# Patient Record
Sex: Male | Born: 1940 | Race: White | Hispanic: No | Marital: Married | Smoking: Former smoker
Health system: Southern US, Community
[De-identification: ages and names within clinical notes are randomized; demographics above are authoritative.]

## PROBLEM LIST (undated history)

## (undated) DIAGNOSIS — K219 Gastro-esophageal reflux disease without esophagitis: Secondary | ICD-10-CM

## (undated) HISTORY — PX: NEPHRECTOMY: SHX65

## (undated) HISTORY — PX: KNEE SURGERY: SHX244

## (undated) HISTORY — PX: EYE SURGERY: SHX253

---

## 2004-04-01 ENCOUNTER — Inpatient Hospital Stay (HOSPITAL_COMMUNITY): Admission: RE | Admit: 2004-04-01 | Discharge: 2004-04-04 | Payer: Self-pay | Admitting: Orthopaedic Surgery

## 2005-04-28 ENCOUNTER — Inpatient Hospital Stay (HOSPITAL_COMMUNITY): Admission: RE | Admit: 2005-04-28 | Discharge: 2005-04-30 | Payer: Self-pay | Admitting: Orthopaedic Surgery

## 2009-02-16 ENCOUNTER — Ambulatory Visit (HOSPITAL_COMMUNITY): Admission: RE | Admit: 2009-02-16 | Discharge: 2009-02-16 | Payer: Self-pay | Admitting: Ophthalmology

## 2010-03-24 LAB — CBC
HCT: 49.8 % (ref 39.0–52.0)
MCHC: 33.7 g/dL (ref 30.0–36.0)
RBC: 6.12 MIL/uL — ABNORMAL HIGH (ref 4.22–5.81)
RDW: 13.8 % (ref 11.5–15.5)

## 2010-03-24 LAB — BASIC METABOLIC PANEL
BUN: 14 mg/dL (ref 6–23)
CO2: 27 mEq/L (ref 19–32)
Creatinine, Ser: 1.1 mg/dL (ref 0.4–1.5)
GFR calc Af Amer: >60 mL/min (ref >60)
Glucose, Bld: 98 mg/dL (ref 70–99)
Potassium: 4.1 mEq/L (ref 3.5–5.1)

## 2010-05-21 NOTE — Discharge Summary (Signed)
Dean [name], [name]                  ACCOUNT NO.:  [id number]   MEDICAL RECORD NO.:  [id number]          PATIENT TYPE:  INP   LOCATION:  5033                         FACILITY:  [facility]   PHYSICIAN:  Claude Manges. Whitfield, M.D.DATE OF BIRTH:  September 01, 1940   DATE OF ADMISSION:  04/01/2004  DATE OF DISCHARGE:  04/04/2004                                 DISCHARGE SUMMARY   ADMISSION DIAGNOSES:  1.  End-stage osteoarthritis left knee.  2.  Tobacco abuse.  3.  Recent upper respiratory infection.   DISCHARGE DIAGNOSES:  1.  End-stage osteoarthritis left knee status post left total knee      arthroplasty.  2.  Acute blood loss anemia secondary to surgery.  3.  Tobacco abuse.  4.  Recent upper respiratory infection.   SURGICAL PROCEDURE:  On April 01, 2004, Dean [name] underwent a left total  knee arthroplasty by Dr. Claude Manges. Whitfield assisted by Richardean Canal,  P.A.C.  Dean [name] had an LCS complete metal back patella, cemented size large,  placed with a DePuy MBT keel tibial tray, cemented size 5.  An LCS complete  primary femoral component, cemented size large left, and an LCS complete RP  insert, size large, 12.5 mm thickness.   COMPLICATIONS:  None.   CONSULTS:  1.  Pharmacy consult for Coumadin therapy April 01, 2004.  2.  Physical therapy consult April 01, 2004.   HISTORY OF PRESENT ILLNESS:  This 70 year old white male patient presented  to Dr. Cleophas Dunker with a 6-7 year history of left knee pain.  At this point,  the pain is intermittent, sharp in sensation over the anterior aspect of the  joint without radiation.  It increases with walking and decreases with rest  and Tylenol or Aleve.  Dean [name] complains of some swelling of the knee.  Dean [name] has  failed conservative treatment and because of that Dean [name] is presenting for a  left knee replacement.   HOSPITAL COURSE:  Dean [name] tolerated surgical procedure well without  immediate postoperative complications.  Dean [name] was transferred to 5000.  Postop  day 1,  Tmax was 102, vitals were stable, left knee dressing was intact,  minimal drainage.  Hemoglobin 13.3, hematocrit 38.7.  Dean [name] was started on  therapy per protocol.   Postop day 2 Dean [name] was tolerating therapy well.  Dean [name] was able to be switched to  p.o. pain meds.  Tmax was 98.5, vitals were stable.   Dean [name] continued to do well over the next several days.  Pain became well  controlled.  April 04, 2004, Tmax had been 101.2, but repeat chest x-ray has  shown no active disease and his white count has decreased.  It was felt Dean [name]  was stable for discharge home and was discharged home later that day.   DISCHARGE INSTRUCTIONS:  Diet:  Dean [name] can resume his regular prehospitalization  diet.  Medications:  Dean [name] may resume his prehospitalization meds except no Relafen  while on Coumadin.  Home meds included only the Relafen 750 mg p.o. b.i.d.  Additional meds at this time include:  1.  Coumadin 5 mg one  tablet p.o. q.6 p.m. for 1 month, dose to be adjusted      by [facility].  2.  Percocet 5/325 mg 1-2 tablets p.o. q.4 hours p.r.n. for pain, 50 with no      refill.   ACTIVITY:  Dean [name] can be out of bed, partial weight bearing 50% or less in the  left leg with use of the walker.  Dean [name] is to have home CPM 0-100 degrees 6-8  hours a day and home health PT per [facility].  Please see the  blue Total Knee Discharge Sheet for further activity instructions.   WOUND CARE:  Dean [name] may shower after no drainage from the wound for 2 days.  Please see the blue Total Knee Discharge Sheet for further wound care  instructions.   FOLLOWUP:  Dean [name] needs to followup with Dr. Cleophas Dunker in our office in  approximately one week and needs to call [phone] for that appointment.   LABORATORY DATA:  Chest x-ray done on April 03, 2004, showed COPD changes  along with chronic interstitial scarring in the left lower lobe and lingular  region.  No acute overlying pulmonary process.   March 29, 2004, white count was 11.  It went to  a high of 14.6 on April 03, 2004, and down to 11.5 on April 04, 2004.  Hemoglobin and hematocrit remained  stable but dropped to a low of 12.5 and 37.2 on April 04, 2004.  Platelets  ranged from a high of 702 on March 29, 2004, to a low of 551 on April 03, 2004.   On April 04, 2004, PT was 20.3, INR 2.2.  On March 29, 2004, the potassium  was 5.6.  It was 4.2 on April 01, 2004 and April 03, 2004.  Glucose ranged  from 98 on March 29, 2004 and April 03, 2004, to a high of 103 on March 30-  31, 2006.  All other laboratory studies were within normal limits.       KED/MEDQ  D:  07/12/2004  T:  07/12/2004  Job:  [id number]

## 2010-05-21 NOTE — H&P (Signed)
Dean [name], Dean [name]                  ACCOUNT NO.:  [id number]   MEDICAL RECORD NO.:  [id number]          PATIENT TYPE:  INP   LOCATION:                               FACILITY:  [facility]   PHYSICIAN:  Claude Manges. Whitfield, M.D.DATE OF BIRTH:  August 26, 1940   DATE OF ADMISSION:  04/01/2004  DATE OF DISCHARGE:                                HISTORY & PHYSICAL   CHIEF COMPLAINT:  Left knee pain for the last six to seven years.   HISTORY OF PRESENT ILLNESS:  This 70 year old white male patient presented  to Dr. Cleophas Dunker with the six a seven-year history of left knee pain. He  said he has had problems in the past with his right knee for probably about  10 years and that he had a right knee arthroscopy by Dr. Cleophas Dunker in 1995.  The right knee is doing well now and the left knee is his more bothersome  knee. He has had no known injury or prior surgery to that left knee.   At this point the left knee pain is described as an intermittent sharp  sensation over the anterior aspect of the joint line without radiation. The  pain increases if he does any walking, if he had been the knee too much or  if he stands on it for any period of time. Pain decreases with rest and the  use of Tylenol or Aleve. He does complain of some swelling of the knee at  times but no popping, catching, grinding, locking or giving way. He has not  had any cortisone injections, viscosupplementation shots in the knee. He  does not ambulate with any assistive devices, but he does have difficulty  walking.   ALLERGIES:  PENICILLIN causes whelps.   CURRENT MEDICATIONS:  Relafen 750 mg one tablet p.o. b.i.d..   PAST MEDICAL HISTORY:  He denies any history of diabetes mellitus,  hypertension, thyroid disease, hiatal hernia, reflux, peptic ulcer disease,  heart disease, asthma or any other chronic medical condition other than  noted previously.   PAST SURGICAL HISTORY:  1.  Right knee arthroscopy by Dr. Claude Manges. Whitfield 1995.  2.  Skin graft to his left ring finger approximately 30 years ago.  3.  Removal left eye cataract.   He denies any complications from the above-mentioned procedures.   SOCIAL HISTORY:  He has a 30-pack-year history of cigarette smoking. He does  drink alcohol about twice a month. He does not use any drugs. He is married  and has two children. He lives with his wife in a one-story house with three  steps into the main entrance. 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 His medical doctor is Dr. Littie Deeds in [place], [place], and his phone number is [phone].   FAMILY HISTORY:  Mother died at age of 28 with hypertension. Father died at  age of 46 with a stroke. He has one living brother age 41 with coronary  artery disease and osteoarthritis. He has three living sisters age 64, 4  and 51 with one with  diabetes. He did have four brothers who have passed  away, one at age 61 with alcohol history. One age 48 due to a car accident,  one at age 59 with polio, one in his 66s with cancer. He also had two  sisters who have passed away, one in her 70s with emphysema one the other  one in her 87s with possible history of AIDS. The daughter is 58, son is 78  and they are both alive and well.   REVIEW OF SYSTEMS:  He has a partial plate denture on his upper jaw line and  full dentures on his lower jaw line. He has had a cold recently but it is  improving. 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 He does not have a living will nor a power of attorney. All other  systems were negative and noncontributory.   PHYSICAL EXAM:  Well-developed, well-nourished white male in no acute  distress. Talks easily with examiner. Walks with a significantly antalgic  gait and a limp. Mood and affect are  appropriate. Height 5 feet 9 inches way  to 170 pounds, BMI is 24.  VITAL SIGNS:  Temperature 97.6 degrees Fahrenheit, pulse 88, respirations 28  and BP 136/84.  HEENT: Normocephalic, atraumatic without frontal or maxillary sinus  tenderness to palpation. Conjunctiva pink. Sclerae and anicteric. PERLA.  EOMs intact. No visible external ear deformities. His hearing is a bit  decreased just generally. Tympanic membranes are pearly gray bilaterally  with good light reflex. Nose and nasal septum midline. Nasal mucosa pink and  moist without exudates or polyps noted. Buccal mucosa pink and moist.  Dentures in place. Pharynx without erythema or exudates. Tongue and uvula  midline. Tongue without fasciculations and uvula rises equal equally with  phonation.  NECK:  No visible masses or lesions noted. Trachea midline. No palpable  lymphadenopathy nor thyromegaly. Carotids +2 bilaterally without bruits.  Full range of motion and nontender to palpation along the cervical spine.  CARDIOVASCULAR:  Heart rate and rhythm regular. S1-S2 present without rubs,  clicks or murmurs noted.  RESPIRATORY:  Respirations even and unlabored. He does have scattered rales  or rhonchi in the left posterior fields. A little bit coarse breath sounds  on the right. These do clear a bit with coughing. He does have a  nonproductive cough at this time.  ABDOMEN:  __________ abdominal contour. Bowel sounds present x4 quadrants.  Soft, nontender to palpation without hepatosplenomegaly nor CVA tenderness.  Femoral pulses +1 bilaterally. Nontender to palpation along the vertebral  column.  BREASTS/GU/RECTAL:  These exams deferred at this time.  MUSCULOSKELETAL:  He is missing the tip of his left index finger from the  DIP joint distally but otherwise upper extremities has no visible  deformities and full range of motion of these extremities without pain. Radial pulses +2. He has full range of motion of his hips, ankles and  toes  bilaterally. DP and PT pulses are +1. No lower extremity edema, no calf pain  with palpation and negative Homans' sign bilaterally.   Right knee is lacking about 10 degrees of full extension.  He has flexion to  120 degrees with a minimal amount of crepitus. No effusion in the knee but  he is tender on both  the medial and lateral joint line, medial more so than  lateral. He is stable to varus and valgus stress. Negative anterior drawer.  Left knee skin is intact without erythema or ecchymosis. He is lacking 10  degrees of full extension, can flex that knee only to about 112 degrees with  a moderate amount of crepitus. He is tender to palpation over the medial  joint line. He is stable to varus and valgus stress. Negative anterior  drawer and a minimal +1 effusion.  NEUROLOGIC:  Alert and oriented x3. Cranial nerves II-XII are grossly  intact. Strength 5/5 bilateral upper and lower extremities. Rapid  alternating movements intact. Deep tendon reflexes 2+ bilateral upper and  lower extremities.   RADIOLOGIC FINDINGS:  X-rays taken of the left knee in February 2006 show a  5 degree varus deformity and complete absence of the medial joint line with  large osteophytes and subchondral sclerosis of both the femur and tibia.  There are degenerative changes noted of the patella and the lateral  compartment as well but not to the extent that there is medially.   IMPRESSION:  1.  End-stage osteoarthritis of the left knee.  2.  Tobacco abuse.  3.  Recent cold.   PLAN:  Mr. Szymczak will be admitted to [facility] on  April 01, 2004, where he will undergo a left total knee arthroplasty by Dr.  Claude Manges. Whitfield. He will undergo all the routine preoperative laboratory  tests and studies prior to this procedure. If he has any medical issues  while he is hospitalized,  we will consult one the Hospitalists.      KED/MEDQ  D:  03/24/2004  T:  03/24/2004  Job:  [id number]

## 2010-05-21 NOTE — Op Note (Signed)
NAMEDAK, SZUMSKI NO.:  [id number]   MEDICAL RECORD NO.:  [id number]          PATIENT TYPE:  INP   LOCATION:  2899                         FACILITY:  [facility]   PHYSICIAN:  Claude Manges. Whitfield, M.D.DATE OF BIRTH:  1940/12/12   DATE OF PROCEDURE:  04/28/2005  DATE OF DISCHARGE:                                 OPERATIVE REPORT   PREOPERATIVE DIAGNOSIS:  End-stage osteoarthritis right knee.   POSTOPERATIVE DIAGNOSIS:  End-stage osteoarthritis right knee.   PROCEDURE:  Right total knee replacement with computer navigation.   SURGEON:  Claude Manges. Cleophas Dunker, M.D.   ASSISTANT:  Richardean Canal, P.A.-C.   ANESTHESIA:  General orotracheal.   COMPLICATIONS:  There no intraoperative complications.   COMPONENTS:  DePuy LCS large femoral component.  A #5 rotating keeled tibial  tray with a 12 mm polyethylene bridging baring, a metal backed three pegged  large patella.  All components were secured polymethyl methacrylate.   PROCEDURE:  With the patient comfortable on the operating table and under  general anesthesia, the left lower extremity was placed a thigh tourniquet.  The leg was then prepped with Betadine scrub and DuraPrep from the  tourniquet to the midfoot.  Sterile draping was performed.   The extremity elevated was Esmarch exsanguinated with a proximal tourniquet  at 350 mmHg.   A midline longitudinal incision was made centered about the patella  extending from the superior pouch the tibial tubercle.  Via sharp  dissection, incision was carried down to subcutaneous tissue.  First layer  of capsule was incised in the midline. A medial parapatellar incision was  made in a straight longitudinal line with the Bovie.  The joint was entered  with minimal clear yellow joint effusion.   The patella was everted 180 degrees laterally.  The knee flexed to 90  degrees.  There was a fixed varus position.  There were  large osteophytes  along the medial and lateral  femoral condyle and complete absence of  articular cartilage in the medial compartment to a great extent lateral  compartment and patellofemoral joint.  A medial release was performed  because of the fixed varus position.  Osteophytes were removed and a  synovectomy performed.   Computer navigation was employed.  The Shantz pins were placed in the tibia  and two in the distal femur.  The computer arrays were then applied.  Points  were established using the pointer to establish the axis of rotation.  Further points were then made for the medial lateral malleolus and those  about the femur determine the appropriate size.  Flexion/extension gaps were  determined and we had to do a further medial release as we were still tight  and we were able to have symmetrical flexion extension gaps.   After computer navigation to establish the appropriate computer points, the  cutting jigs were applied and then fixed with small pins correspond to the  points that were made via the computer.  Cuts were then made on the proximal  tibia and then cuts were made on the femur, corresponding to the computer  navigation.  ACL and PCL were removed as were medial and lateral menisci,  osteophytes were removed from the medial and lateral femoral condyles  posteriorly and osteophytes were removed from the medial tibial plateau.  A  lamina spreader was inserted to remove remnants of ACL and PCL posteriorly  and the osteophytes.  We again checked all of our cuts with the computer and  were with excellent position.   The final cuts were then made on the femur.  Retractors were placed behind  the tibia and tibial cuts were made to correspond to a #5 rotating tibial  tray.  The center hole was made followed by the keeled cut.  The trial  tibial tray was inserted.  We determined that using the flexion/extension  gaps a 12.5 mm flexion and extension gaps were symmetrical.  The 12 mm  polyethylene component was then  inserted followed by the standard femur.  We  had full extension, no opening with varus or valgus stress and we had normal  alignment.   The patella was then prepared by removing 12 mm of bony thickness.  The  trial component was applied and again through a full range of motion there  was no subluxation, dislocation.   Trial components were removed.  Joint was then copiously irrigated with jet  saline.  With the knee flexed, the tibial tray was inserted with polymethyl  methacrylate.  Extraneous methacrylate was removed.  The polyethylene liner  applied followed by the cemented femur, extraneous methacrylate was removed.  The knee was placed in extension.  Patella applied with methacrylate and a  patellar clamp.   After complete maturation of methacrylate, the joint was inspected.  We had  excellent range of motion.  We had reestablished physiologic valgus and full  extension.  The patient had a fixed varus position preoperatively with about  15 degrees of flexion contracture.  Tourniquet was deflated.  Gross bleeders  were Bovie coagulated.  There was no further extraneous methacrylate.  Joint  was again copiously irrigated with saline solution.  Deep capsule closed  with interrupted #1 Ethibond, superficial capsule closed with a running 0  Vicryl, skin closed with skin clips.  Sterile bulky dressing was applied  followed by an Ace bandage.   Excellent capillary refill to the toe.   The patient tolerated the procedure without complications.      Claude Manges. Cleophas Dunker, M.D.  Electronically Signed     PWW/MEDQ  D:  04/28/2005  T:  04/29/2005  Job:  [id number]

## 2010-05-21 NOTE — Op Note (Signed)
Dean [name], Dean [name]                  ACCOUNT NO.:  [id number]   MEDICAL RECORD NO.:  [id number]          PATIENT TYPE:  INP   LOCATION:  2550                         FACILITY:  [facility]   PHYSICIAN:  Claude Manges. Whitfield, M.D.DATE OF BIRTH:  August 04, 1940   DATE OF PROCEDURE:  04/01/2004  DATE OF DISCHARGE:                                 OPERATIVE REPORT   PREOPERATIVE DIAGNOSIS:  End-stage osteoarthritis, left knee.   POSTOPERATIVE DIAGNOSIS:  End-stage osteoarthritis, left knee.   PROCEDURE:  Left total knee replacement (computer navigation).   SURGEON:  Claude Manges. Cleophas Dunker, M.D.   ASSISTANT:  Richardean Canal, P.A.   ANESTHESIA:  General orotracheal.   COMPLICATIONS:  None.   COMPONENTS:  Depuy LCS large femoral component with #5 rotating keeled  tibial tray with a 12.5-mm bridging bearing, a metal-backed 3-pegged  rotating patella; all were secured with polymethyl methacrylate.   PROCEDURE:  With the patient comfortable on the operating table and under  general orotracheal anesthesia, nursing staff inserted a Foley catheter.  The left lower extremity had been determined to be the operative extremity  preoperatively in the holding area.  It was placed in a thigh tourniquet and  then prepped with Betadine scrub and then Duraprep from the tourniquet to  the mid-foot.  Sterile draping was performed.  With the extremity still  elevated, it was Esmarch-exsanguinated with a proximal tourniquet at 350  mmHg.   A midline longitudinal incision was made centered around the patella,  extending from the superior parts of tibial tubercle.  Via sharp dissection,  the incision was carried down to the subcutaneous tissue.  The first layer  of capsule was incised in the midline.  There was a prepatellar bursa that  was excised.  A medial parapatellar incision was made in a straight  longitudinal line with the Bovie.  There was a small clear joint effusion.  The patella was then easily everted 180  degrees laterally and the knee  flexed to 90 degrees.  There was a fixed varus position and a medial release  was performed.  There were large osteophytes on the medial and lateral  femoral condyle and the medial tibial plateau.  There was complete absence  of articular cartilage in the medial femoral condyle __________  medial  tibial plateau.  There was considerable chondromalacia at the femoral  condyle laterally.  There was considerable chondromalacia of the patella.   Computer navigation was utilized.  Two Schanz pins were placed in the distal  femur and 2 in the mid-tibia and the arrays were then applied.  Using the  computer, we had excellent visualization of the arrays.  We then morphed the  femur and the tibia to obtain the appropriate landmarks to size the femoral  and tibial components.  We also used the tensioners to obtain the  appropriate flexion and extension; again, lateral release was necessary.  After establishing the landmarks, the computer indicated the size of the  prosthesis and the appropriate areas to place the cutting guides.  The  initial cut was made on the tibia transversely with  a 7-degree posterior  inclination.  We fine-tuned the cuts to correspond to the tibial  measurements; in a similar fashion, we made cuts on the femur, corresponding  with the femoral cuts.  Flexion and extension gap appeared to be symmetrical  with the 12.5-mm bridging bearing.  Retractors were placed about the tibia,  laminar spreaders were inserted, soft tissue was removed including the  medial and lateral menisci and any remnants of ACL and PCL.  Osteophytes  were removed from the posterior femoral condyle medially and laterally.  Tibial cuts were then made to accept a #5 tibial tray.  We trialed the femur  and the tibia with a 12.5-mm bridging bearing and had excellent position and  maybe 1-2 degrees of extension and no opening with the varus or valgus  stress.  The patella was then  prepared by removing 12 mm of bone, leaving  about 15 mm of patellar thickness.  The tri-peg guide was then applied and  the trial patella was then inserted.  We had an excellent range of motion  with no subluxation.   The trial components were then removed, the joint was copiously irrigated  with saline solution.  The #5 keeled tibial tray was then impacted with  polymethyl methacrylate.  A 12.5-mm polyethylene bearing was then applied,  followed by the femur with polymethyl methacrylate; extraneous methacrylate  was removed with Michaelle Copas.  The knee was placed in extension, the patella  applied with a bone clamp and methacrylate; extraneous methacrylate was  removed from about the patella.  After complete maturation of the  methacrylate, the joint was inspected and any extraneous hardened  methacrylate was removed with a small osteotome.  The wound was copiously  irrigated throughout the operative procedure with jet saline.  Tourniquet  was deflated and gross bleeders were Bovie-coagulated; we had a nice dry  field.  The deep capsule was then closed with interrupted #1 Ethibond,  superficial capsule closed with a running 0 Vicryl, subcu with 2-0 Vicryl,  skin closed with skin clips.  A sterile bulky dressing  was applied.   The patient tolerated the procedure without complications.      PWW/MEDQ  D:  04/01/2004  T:  04/01/2004  Job:  [id number]

## 2010-05-21 NOTE — H&P (Signed)
Dean [name], Dean [name]                  ACCOUNT NO.:  [id number]   MEDICAL RECORD NO.:  [id number]           PATIENT TYPE:   LOCATION:                                 FACILITY:   PHYSICIAN:  Legrand Pitts. Duffy, P.A.   DATE OF BIRTH:  25-Mar-1940   DATE OF ADMISSION:  04/28/2005  DATE OF DISCHARGE:                                HISTORY & PHYSICAL   CHIEF COMPLAINT:  Right knee pain for the last four to five years.   HISTORY OF PRESENT ILLNESS:  This 70 year old white male patient with a  history of a left knee replacement by Dr. Cleophas Dunker in March 2006 presents  with a four to five-year history of gradual onset but progressively  worsening right knee pain.  He does have a history of a right knee  arthroscopy in 2002 but no other injury to the knee.  The left knee  replacement is doing very well at this time.   At this point the right knee pain is described as constant with any  weightbearing and some aching sensation diffuse about the joint with  radiation into his ankle at times.  The pain increases with weightbearing  and decreases with Tylenol.  He denies any popping, catching, grinding,  locking, giving away or swelling of the knee.  He denies any night pain.  He  is not currently ambulating with any assistive devices and is currently  taking Tylenol Arthritis for the pain.   ALLERGIES:  PENICILLIN causes welts.   CURRENT MEDICATIONS:  Tylenol arthritis one tablet p.o. b.i.d. p.r.n.Marland Kitchen   PAST MEDICAL HISTORY:  He denies any history of diabetes mellitus,  hypertension, thyroid disease, hiatal hernia, reflux, peptic ulcer disease,  heart disease, asthma or any other chronic medical condition other than  noted previously.   PAST SURGICAL HISTORY:  1.  Right knee arthroscopy by Dr. Claude Manges.  [place], 1995.  2.  Skin graft to the left ring finger 30 years ago.  3.  Excision, left eye cataract, 2003.  4.  Excision, right eye cataract, 2006.  5.  Left total knee arthroplasty by Dr.  Claude Manges. [place] April 01, 2004.   He denies any complications from the above-mentioned procedures.   SOCIAL HISTORY:  He has a 35 pack-year history of cigarette smoking and  still currently smokes about a pack of cigarettes a day.  He drinks about  two to three beers a week.  He does not use any drugs.  He is married and  has two children.  He lives with his wife in a one-story house with three  steps into the main entrance.  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  His medical doctor is in a family practice in [place] at  [phone].   FAMILY HISTORY:  Mother died at age of 66 with hypertension.  Father died at  age of 19 with a stroke.  He has one living brother, age 7, with obesity  and coronary artery disease.  He has three living sisters aged 86, 24 and  57,  and one of those siblings have diabetes.  He had four brothers who  passed away, one at age 36 with alcohol, one at age 72 due to an MVA, and  one age 33 with polio, one in his 80s with cancer.  He also has had two  sisters who have passed away, one in her 75s with emphysema and the other in  her 43s with possible history of AIDS.  His daughter is 52 and his son is  22, and they are both alive and well.   REVIEW OF SYSTEMS:  He does have partial plate denture on the upper jaw line  and full dentures on the lower.  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  He has  had a cold for about a week with a nonproductive cough.  He does complain of  occasional shoulder pain.  He does not have a living will nor a power of  attorney.  All other systems are negative and noncontributory.   PHYSICAL EXAMINATION:  GENERAL:  Well-developed, well-nourished, thin white  male in no acute distress.  Walks with a slight limp.  Mood and affect are  appropriate.  Talks easily with examiner.  Height 5 feet 9 inches, weight  170 pounds, BMI is 24.  VITAL  SIGNS:  Temperature 98 degrees Fahrenheit, pulse 76, respirations 12  and BP 128/76.  HEENT:  Normocephalic, atraumatic, without frontal or maxillary sinus  tenderness to palpation.  Conjunctivae pink.  Sclerae anicteric. PERRLA.  EOMs intact.  No visible external ear deformities.  Hearing grossly intact.  Tympanic membranes pearly gray bilaterally with good light reflex.  Nose:  The nasal septum midline.  Nasal mucosa pink and moist without exudates or  polyps noted.  Buccal mucosa pink and moist.  Dentition has dentures in  place.  Pharynx without erythema or exudates.  Tongue and uvula midline.  Tongue without fasciculations and uvula rises equally with phonation.  NECK:  No visible masses or lesions noted.  Trachea midline.  No palpable  lymphadenopathy nor thyromegaly.  Carotids +2 bilaterally without bruits.  Full range of range of motion and nontender to palpation along the cervical  spine.  CARDIOVASCULAR:  Heart rate and rhythm regular.  S1 and S2 present without  rubs, clicks or murmurs noted.  RESPIRATORY:  Respirations even and unlabored.  Breath sounds clear to  auscultation bilaterally without rales or wheezes noted.  ABDOMEN:  Rounded abdominal contour.  Bowel sounds present x4 quadrants.  Soft, nontender to palpation without hepatosplenomegaly nor CVA tenderness.  Femoral pulses +2 bilaterally.  BACK:  Nontender to palpation along the vertebral column.  BREASTS, GENITOURINARY, RECTAL:  These exams deferred at this time.  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  He  has full range of motion of his upper extremities without pain.  Radial  pulses +2 bilaterally.  Full range of motion of his hips, ankles and toes  bilaterally.  DP and PT pulses are +2.  No calf pain with palpation. Negative Homans' sign and no lower extremity edema.   Left knee has a well-healed midline  incision.  Skin intact without erythema  or ecchymosis.  He has full extension and flexion to 110 degrees without  crepitus.  No pain with palpation along the joint line.  Stable to varus and  valgus stress.  Negative anterior drawer.  Right knee is lacking about 15  degrees to full extension, can flex to 110 degrees with mild crepitus.  He  is tender to palpation over the medial joint line.  No effusion.  Stable to  varus and valgus stress.  Negative anterior drawer.   NEUROLOGIC:  Alert and oriented x3.  Cranial nerves II-XII are grossly  intact.  Strength 5/5 bilateral upper and lower extremities.  Rapid  alternating movements intact.  Deep tendon reflexes 2+ bilateral upper and  lower extremities.  Sensation intact to light touch.   RADIOLOGIC FINDINGS:  X-rays taken of both knees in March 2007 show the left  knee prosthesis in good position and alignment with a good cement mantle.  The right knee appears to be in about 5 degrees of varus with bone-on-bone  contact in the medial compartment and significant degenerative changes in  all three compartments.   IMPRESSION:  1.  End-stage osteoarthritis, right knee, status post left knee replacement,      2006.  2.  History of cataracts.   PLAN:  Mr. Havard will be admitted to [facility] on April 28, 2005,  where he will undergo a right total knee arthroplasty by Dr. Claude Manges.  [place].  He will undergo all the routine preoperative laboratory tests  and studies prior to this procedure.  If he has any medical issues while he  is hospitalized, we will consult the hospitalists.      Legrand Pitts Duffy, P.A.     KED/MEDQ  D:  04/13/2005  T:  04/13/2005  Job:  [id number]

## 2016-07-19 ENCOUNTER — Emergency Department (HOSPITAL_COMMUNITY): Payer: Medicare Other

## 2016-07-19 ENCOUNTER — Emergency Department (HOSPITAL_COMMUNITY)
Admission: EM | Admit: 2016-07-19 | Discharge: 2016-07-19 | Disposition: A | Discharge status: Home / Self Care | Payer: Medicare Other | Attending: Emergency Medicine | Admitting: Emergency Medicine

## 2016-07-19 ENCOUNTER — Other Ambulatory Visit: Payer: Self-pay

## 2016-07-19 ENCOUNTER — Encounter (HOSPITAL_COMMUNITY): Payer: Self-pay

## 2016-07-19 DIAGNOSIS — Z87891 Personal history of nicotine dependence: Secondary | ICD-10-CM | POA: Insufficient documentation

## 2016-07-19 DIAGNOSIS — Z7982 Long term (current) use of aspirin: Secondary | ICD-10-CM | POA: Diagnosis not present

## 2016-07-19 DIAGNOSIS — H4901 Third [oculomotor] nerve palsy, right eye: Secondary | ICD-10-CM

## 2016-07-19 DIAGNOSIS — H532 Diplopia: Secondary | ICD-10-CM | POA: Insufficient documentation

## 2016-07-19 DIAGNOSIS — R51 Headache: Secondary | ICD-10-CM | POA: Diagnosis present

## 2016-07-19 DIAGNOSIS — Z862 Personal history of diseases of the blood and blood-forming organs and certain disorders involving the immune mechanism: Secondary | ICD-10-CM | POA: Diagnosis not present

## 2016-07-19 HISTORY — DX: Gastro-esophageal reflux disease without esophagitis: K21.9

## 2016-07-19 LAB — COMPREHENSIVE METABOLIC PANEL
ALK PHOS: 54 U/L (ref 38–126)
ALT: 11 U/L — AB (ref 17–63)
AST: 18 U/L (ref 15–41)
Albumin: 4 g/dL (ref 3.5–5.0)
Anion gap: 10 (ref 5–15)
BILIRUBIN TOTAL: 0.9 mg/dL (ref 0.3–1.2)
BUN: 21 mg/dL — ABNORMAL HIGH (ref 6–20)
CALCIUM: 9.7 mg/dL (ref 8.9–10.3)
CO2: 22 mmol/L (ref 22–32)
CREATININE: 1.4 mg/dL — AB (ref 0.61–1.24)
Chloride: 105 mmol/L (ref 101–111)
GFR calc non Af Amer: 47 mL/min — ABNORMAL LOW (ref >60)
GFR, EST AFRICAN AMERICAN: 55 mL/min — AB (ref >60)
Glucose, Bld: 99 mg/dL (ref 65–99)
Potassium: 4.7 mmol/L (ref 3.5–5.1)
SODIUM: 137 mmol/L (ref 135–145)
TOTAL PROTEIN: 7 g/dL (ref 6.5–8.1)

## 2016-07-19 LAB — I-STAT TROPONIN, ED: Troponin i, poc: 0 ng/mL (ref 0.00–0.08)

## 2016-07-19 LAB — DIFFERENTIAL
Basophils Absolute: 0.1 10*3/uL (ref 0.0–0.1)
Basophils Relative: 1 %
Eosinophils Absolute: 0.1 10*3/uL (ref 0.0–0.7)
Eosinophils Relative: 1 %
LYMPHS ABS: 1.5 10*3/uL (ref 0.7–4.0)
LYMPHS PCT: 23 %
MONO ABS: 0.7 10*3/uL (ref 0.1–1.0)
MONOS PCT: 11 %
NEUTROS ABS: 4.2 10*3/uL (ref 1.7–7.7)
Neutrophils Relative %: 64 %

## 2016-07-19 LAB — I-STAT CHEM 8, ED
BUN: 24 mg/dL — AB (ref 6–20)
CALCIUM ION: 1.19 mmol/L (ref 1.15–1.40)
CHLORIDE: 104 mmol/L (ref 101–111)
Creatinine, Ser: 1.7 mg/dL — ABNORMAL HIGH (ref 0.61–1.24)
GLUCOSE: 101 mg/dL — AB (ref 65–99)
HCT: 41 % (ref 39.0–52.0)
Hemoglobin: 13.9 g/dL (ref 13.0–17.0)
Potassium: 4.7 mmol/L (ref 3.5–5.1)
Sodium: 140 mmol/L (ref 135–145)
TCO2: 24 mmol/L (ref 0–100)

## 2016-07-19 LAB — CBC
HEMATOCRIT: 41.7 % (ref 39.0–52.0)
HEMOGLOBIN: 13.5 g/dL (ref 13.0–17.0)
MCH: 30.7 pg (ref 26.0–34.0)
MCHC: 32.4 g/dL (ref 30.0–36.0)
MCV: 94.8 fL (ref 78.0–100.0)
PLATELETS: 516 10*3/uL — AB (ref 150–400)
RBC: 4.4 MIL/uL (ref 4.22–5.81)
RDW: 14.5 % (ref 11.5–15.5)
WBC: 6.6 10*3/uL (ref 4.0–10.5)

## 2016-07-19 LAB — APTT: aPTT: 37 seconds — ABNORMAL HIGH (ref 24–36)

## 2016-07-19 LAB — SEDIMENTATION RATE: Sed Rate: 17 mm/hr — ABNORMAL HIGH (ref 0–16)

## 2016-07-19 LAB — PROTIME-INR
INR: 1.07
Prothrombin Time: 14 seconds (ref 11.4–15.2)

## 2016-07-19 MED ORDER — ASPIRIN EC 81 MG PO TBEC: 81.0000 mg | DELAYED_RELEASE_TABLET | Freq: Every day | ORAL | 0 refills | Status: AC

## 2016-07-19 MED ORDER — GADOBENATE DIMEGLUMINE 529 MG/ML IV SOLN
15.0000 mL | Freq: Once | INTRAVENOUS | Status: AC
  Administered 2016-07-19: 7 mL via INTRAVENOUS

## 2016-07-19 NOTE — Discharge Instructions (Signed)
Follow-up with your ophthalmologist for the results of the sedimentation rate.

## 2016-07-19 NOTE — ED Triage Notes (Addendum)
Pt sent here by eye dr. Pt endorses right eye pain, visual changes and double vision with headache x 2 weeks. Pt unable to detect movement in lower field peripheral field upon exam. VSS. Pt ambulatory and denies 1 sided weakness or any other neuro sx.

## 2016-07-19 NOTE — ED Provider Notes (Signed)
[facility]-EMERGENCY DEPT Provider Note   CSN: [id number] Arrival date & time: 07/19/16  1231     History   Chief Complaint Chief Complaint  Patient presents with  . Visual Field Change    HPI Dean [name] is a 76 y.o. male.  HPI Patient sent in from his ophthalmologist for stroke rule out. For the last 2 weeks approximately his had double vision. Also around 1 week ago started a headache behind his right eye. Has double vision. States he has double vision in his 1 eye but it goes away when he covers the other eye. No chest pain. No trouble breathing. No trauma. No fevers. States her headaches been there for around a week now. No difficulty walking. States we look straight ahead he will see to things. He had his eyes dilated at ophthalmology. Past Medical History:  Diagnosis Date  . GERD (gastroesophageal reflux disease)   Polycythemia  There are no active problems to display for this patient.   Past Surgical History:  Procedure Laterality Date  . EYE SURGERY    . KNEE SURGERY    . NEPHRECTOMY         Home Medications    Prior to Admission medications   Medication Sig Start Date End Date Taking? Authorizing Provider  aspirin EC 81 MG tablet Take 1 tablet (81 mg total) by mouth daily. 07/19/16   Benjiman Core, MD    Family History History reviewed. No pertinent family history.  Social History Social History  Substance Use Topics  . Smoking status: Former Smoker    Quit date: 07/20/2006  . Smokeless tobacco: Not on file  . Alcohol use No     Allergies   Penicillins   Review of Systems Review of Systems  Constitutional: Negative for appetite change.  HENT: Negative for dental problem.   Eyes: Positive for visual disturbance. Negative for discharge.  Cardiovascular: Negative for chest pain.  Gastrointestinal: Negative for abdominal pain.  Genitourinary: Negative for flank pain.  Musculoskeletal: Negative for back pain.  Neurological: Positive for  headaches.  Psychiatric/Behavioral: Negative for confusion.     Physical Exam Updated Vital Signs BP 137/82   Pulse 81   Temp 98.1 F (36.7 C) (Oral)   Resp 17   Ht 5\' 9"  (1.753 m)   Wt 61.2 kg (135 lb)   SpO2 97%   BMI 19.94 kg/m   Physical Exam  Constitutional: He appears well-developed and well-nourished.  Eyes:  Pupils already dilated by ophthalmology. Left eye eye movements intact. Right eye will not cross the midline to look medially. Will look laterally and may have some decreased downward and upward gaze. Somewhat slowed opening of the right eye.  Neck: Neck supple.  Cardiovascular: Normal rate.   Pulmonary/Chest: Effort normal.  Skin: Skin is warm. Capillary refill takes less than 2 seconds.  Psychiatric: He has a normal mood and affect.     ED Treatments / Results  Labs (all labs ordered are listed, but only abnormal results are displayed) Labs Reviewed  APTT - Abnormal; Notable for the following:       Result Value   aPTT 37 (*)    All other components within normal limits  CBC - Abnormal; Notable for the following:    Platelets 516 (*)    All other components within normal limits  COMPREHENSIVE METABOLIC PANEL - Abnormal; Notable for the following:    BUN 21 (*)    Creatinine, Ser 1.40 (*)    ALT 11 (*)  GFR calc non Af Amer 47 (*)    GFR calc Af Amer 55 (*)    All other components within normal limits  I-STAT CHEM 8, ED - Abnormal; Notable for the following:    BUN 24 (*)    Creatinine, Ser 1.70 (*)    Glucose, Bld 101 (*)    All other components within normal limits  PROTIME-INR  DIFFERENTIAL  SEDIMENTATION RATE  I-STAT TROPOININ, ED    EKG  EKG Interpretation None       Radiology Ct Head Wo [name]  Result Date: 07/19/2016 CLINICAL DATA:  Blurry vision for 3 weeks, worsening. No reported injury. EXAM: CT HEAD WITHOUT [name] TECHNIQUE: Contiguous axial images were obtained from the base of the skull through the vertex without  intravenous [name]. COMPARISON:  None. FINDINGS: Brain: No evidence of parenchymal hemorrhage or extra-axial fluid collection. No mass lesion, mass effect, or midline shift. No CT evidence of acute infarction. Nonspecific moderate subcortical and periventricular white matter hypodensity, most in keeping with chronic small vessel ischemic change. Cerebral volume is age appropriate. No ventriculomegaly. Vascular: No acute abnormality. Skull: No evidence of calvarial fracture. Sinuses/Orbits: The visualized paranasal sinuses are essentially clear. Other:  The mastoid air cells are unopacified. IMPRESSION: 1.  No evidence of acute intracranial abnormality. 2. Moderate chronic small vessel ischemic change. Electronically Signed   By: Delbert Phenix M.D.   On: 07/19/2016 14:21   Dean [name]  Result Date: 07/19/2016 CLINICAL DATA:  76 y/o M; right eye pain, visual changes, and double vision with headache for 2 weeks. EXAM: MRI HEAD WITHOUT [name] MRA HEAD WITHOUT [name] MRA NECK WITHOUT AND WITH [name] TECHNIQUE: Multiplanar, multiecho pulse sequences of the brain and surrounding structures were obtained without intravenous [name]. Angiographic images of the Circle of Willis were obtained using MRA technique without intravenous [name]. Angiographic images of the neck were obtained using MRA technique without and with intravenous [name]. Carotid stenosis measurements (when applicable) are obtained utilizing NASCET criteria, using the distal internal carotid diameter as the denominator. [name]:  7 cc MultiHance COMPARISON:  None. FINDINGS: MRI HEAD FINDINGS Brain: No acute infarction, hemorrhage, hydrocephalus, extra-axial collection or mass lesion. Nonspecific foci of T2 FLAIR hyperintense signal abnormality in subcortical and periventricular white matter are compatible with moderate chronic microvascular ischemic changes. Moderate brain parenchymal volume loss. Punctate focus of  susceptibility hypointensity within right parietal periventricular white matter from hemosiderin deposition of chronic microhemorrhage. Vascular: As below. Skull and upper cervical spine: Normal marrow signal. Sinuses/Orbits: Small left maxillary sinus mucous retention cyst. Otherwise negative. Other: None. MRA HEAD FINDINGS Anterior circulation: No large vessel occlusion, aneurysm, or significant stenosis is identified. Posterior circulation: No acute fracture or dislocation identified. Anatomic variant: Patent anterior communicating artery. No posterior communicating artery identified. Fenestrated left A1. MRA NECK FINDINGS Aortic arch: Patent. Right common carotid artery: Patent. Right internal carotid artery: Patent. Right vertebral artery: Patent. Left common carotid artery: Patent. Left Internal carotid artery: Patent. Left Vertebral artery: Patent. There is no evidence of high-grade stenosis, dissection, or aneurysm. IMPRESSION: MRI head: 1. No acute intracranial abnormality. 2. Moderate chronic microvascular ischemic changes and moderate parenchymal volume loss of the brain. MRA head: Patent circle of Willis. No large vessel occlusion, aneurysm, or significant stenosis is identified. MRA neck: Patent carotid and vertebral arteries. No dissection, aneurysm, or significant stenosis is identified. Electronically Signed   By: Mitzi Hansen M.D.   On: 07/19/2016 17:56   Dean [name]  Result Date: 07/19/2016 CLINICAL DATA:  76 y/o M; right eye pain, visual changes, and double vision with headache for 2 weeks. EXAM: MRI HEAD WITHOUT [name] MRA HEAD WITHOUT [name] MRA NECK WITHOUT AND WITH [name] TECHNIQUE: Multiplanar, multiecho pulse sequences of the brain and surrounding structures were obtained without intravenous [name]. Angiographic images of the Circle of Willis were obtained using MRA technique without intravenous [name]. Angiographic images of the neck were  obtained using MRA technique without and with intravenous [name]. Carotid stenosis measurements (when applicable) are obtained utilizing NASCET criteria, using the distal internal carotid diameter as the denominator. [name]:  7 cc MultiHance COMPARISON:  None. FINDINGS: MRI HEAD FINDINGS Brain: No acute infarction, hemorrhage, hydrocephalus, extra-axial collection or mass lesion. Nonspecific foci of T2 FLAIR hyperintense signal abnormality in subcortical and periventricular white matter are compatible with moderate chronic microvascular ischemic changes. Moderate brain parenchymal volume loss. Punctate focus of susceptibility hypointensity within right parietal periventricular white matter from hemosiderin deposition of chronic microhemorrhage. Vascular: As below. Skull and upper cervical spine: Normal marrow signal. Sinuses/Orbits: Small left maxillary sinus mucous retention cyst. Otherwise negative. Other: None. MRA HEAD FINDINGS Anterior circulation: No large vessel occlusion, aneurysm, or significant stenosis is identified. Posterior circulation: No acute fracture or dislocation identified. Anatomic variant: Patent anterior communicating artery. No posterior communicating artery identified. Fenestrated left A1. MRA NECK FINDINGS Aortic arch: Patent. Right common carotid artery: Patent. Right internal carotid artery: Patent. Right vertebral artery: Patent. Left common carotid artery: Patent. Left Internal carotid artery: Patent. Left Vertebral artery: Patent. There is no evidence of high-grade stenosis, dissection, or aneurysm. IMPRESSION: MRI head: 1. No acute intracranial abnormality. 2. Moderate chronic microvascular ischemic changes and moderate parenchymal volume loss of the brain. MRA head: Patent circle of Willis. No large vessel occlusion, aneurysm, or significant stenosis is identified. MRA neck: Patent carotid and vertebral arteries. No dissection, aneurysm, or significant stenosis is identified.  Electronically Signed   By: Mitzi Hansen M.D.   On: 07/19/2016 17:56   Dean [name]  Result Date: 07/19/2016 CLINICAL DATA:  76 y/o M; right eye pain, visual changes, and double vision with headache for 2 weeks. EXAM: MRI HEAD WITHOUT [name] MRA HEAD WITHOUT [name] MRA NECK WITHOUT AND WITH [name] TECHNIQUE: Multiplanar, multiecho pulse sequences of the brain and surrounding structures were obtained without intravenous [name]. Angiographic images of the Circle of Willis were obtained using MRA technique without intravenous [name]. Angiographic images of the neck were obtained using MRA technique without and with intravenous [name]. Carotid stenosis measurements (when applicable) are obtained utilizing NASCET criteria, using the distal internal carotid diameter as the denominator. [name]:  7 cc MultiHance COMPARISON:  None. FINDINGS: MRI HEAD FINDINGS Brain: No acute infarction, hemorrhage, hydrocephalus, extra-axial collection or mass lesion. Nonspecific foci of T2 FLAIR hyperintense signal abnormality in subcortical and periventricular white matter are compatible with moderate chronic microvascular ischemic changes. Moderate brain parenchymal volume loss. Punctate focus of susceptibility hypointensity within right parietal periventricular white matter from hemosiderin deposition of chronic microhemorrhage. Vascular: As below. Skull and upper cervical spine: Normal marrow signal. Sinuses/Orbits: Small left maxillary sinus mucous retention cyst. Otherwise negative. Other: None. MRA HEAD FINDINGS Anterior circulation: No large vessel occlusion, aneurysm, or significant stenosis is identified. Posterior circulation: No acute fracture or dislocation identified. Anatomic variant: Patent anterior communicating artery. No posterior communicating artery identified. Fenestrated left A1. MRA NECK FINDINGS Aortic arch: Patent. Right common carotid artery: Patent. Right internal carotid  artery: Patent. Right vertebral artery:  Patent. Left common carotid artery: Patent. Left Internal carotid artery: Patent. Left Vertebral artery: Patent. There is no evidence of high-grade stenosis, dissection, or aneurysm. IMPRESSION: MRI head: 1. No acute intracranial abnormality. 2. Moderate chronic microvascular ischemic changes and moderate parenchymal volume loss of the brain. MRA head: Patent circle of Willis. No large vessel occlusion, aneurysm, or significant stenosis is identified. MRA neck: Patent carotid and vertebral arteries. No dissection, aneurysm, or significant stenosis is identified. Electronically Signed   By: Mitzi HansenLance  Furusawa-Stratton M.D.   On: 07/19/2016 17:56    Procedures Procedures (including critical care time)  Medications Ordered in ED Medications  gadobenate dimeglumine (MULTIHANCE) injection 15 mL (7 mLs Intravenous [name] Given 07/19/16 1702)     Initial Impression / Assessment and Plan / ED Course  I have reviewed the triage vital signs and the nursing notes.  Pertinent labs & imaging results that were available during my care of the patient were reviewed by me and considered in my medical decision making (see chart for details).     Patient with likely partial third nerve palsy. Seen by ophthalmology and neurology today. MRI reassuring. Patient was not willing to wait for the results of sedimentation rate. Can be followed by his ophthalmologist for his primary care doctor. Discharge home  Final Clinical Impressions(s) / ED Diagnoses   Final diagnoses:  Third nerve palsy of right eye    New Prescriptions New Prescriptions   ASPIRIN EC 81 MG TABLET    Take 1 tablet (81 mg total) by mouth daily.     Benjiman CorePickering, Ellayna Hilligoss, MD 07/19/16 1924

## 2016-07-19 NOTE — ED Notes (Signed)
Patient to MRI.

## 2016-07-19 NOTE — Consult Note (Signed)
Requesting Physician: Dr. Alvino Chapel    Chief Complaint: Diplopia  History obtained from:  Patient and Chart   HPI:                                                                                                                                      Dean [name] is an 76 y.o. male with a OS/OD diplopia x two weeks. Per report, he was sent to [facility] by his opthalmologist for stroke rule out. He developed a headache behind his right eye approximately one week ago. He states that this is coming and going, maximal about a 3 out of 10 on a pain scale. He denies jaw claudication.  He does endorse significant weight loss, which she attributes to acid reflux. This has been about 40 pounds over the past year   Stroke Risk Factors - none   Past Medical History:  Diagnosis Date  . GERD (gastroesophageal reflux disease)     Past Surgical History:  Procedure Laterality Date  . EYE SURGERY    . KNEE SURGERY    . NEPHRECTOMY      History reviewed. No pertinent family history.   reports that he quit smoking about 10 years ago. He does not have any smokeless tobacco history on file. He reports that he does not drink alcohol or use drugs.  Allergies  Allergen Reactions  . Penicillins Hives    Medications:                                                                                                                       No outpatient prescriptions have been marked as taking for the 07/19/16 encounter [facility] Encounter).    Review Of Systems:                                                                                         14 point review of systems was performed and is negative except as noted in the history of present illness.  Blood pressure 100/68, pulse 89, temperature 98.1 F (36.7 C),  temperature source Oral, resp. rate 17, height 5' 9"  (1.753 m), weight 61.2 kg (135 lb), SpO2 99 %.   Physical Exam  Constitutional: Appears well-developed and well-nourished.  Psych: Affect  appropriate to situation Eyes: No scleral injection HENT: No OP obstrucion Head: Normocephalic.  Cardiovascular: Normal rate and regular rhythm.  Respiratory: Effort normal and breath sounds normal to anterior ascultation GI: Soft.  No distension. There is no tenderness.  Skin: WDI  Neuro: Mental Status: Patient is awake, alert, oriented to person, place, month, year, and situation. Patient is able to give a clear and coherent history. No signs of aphasia or neglect Cranial Nerves: II: Visual Fields are full. Pupils are pharmacologically dilated   III,IV, VI: He has impaired adduction and depression of the right eye. V: Facial sensation is symmetric to temperature VII: Facial movement is symmetric.  VIII: hearing is intact to voice X: Uvula elevates symmetrically XI: Shoulder shrug is symmetric. XII: tongue is midline without atrophy or fasciculations.  Motor: Tone is normal. Bulk is normal. 5/5 strength was present in all four extremities.  Sensory: Sensation is symmetric to light touch and temperature in the arms and legs. Deep Tendon Reflexes: 2+ and symmetric in the biceps and patellae.  Plantars: Toes are downgoing bilaterally.  Cerebellar: FNF  intact bilaterally    Lab Results: Basic Metabolic Panel:  Recent Labs Lab 07/19/16 1239 07/19/16 1302  NA 137 140  K 4.7 4.7  CL 105 104  CO2 22  --   GLUCOSE 99 101*  BUN 21* 24*  CREATININE 1.40* 1.70*  CALCIUM 9.7  --     Liver Function Tests:  Recent Labs Lab 07/19/16 1239  AST 18  ALT 11*  ALKPHOS 54  BILITOT 0.9  PROT 7.0  ALBUMIN 4.0   No results for input(s): LIPASE, AMYLASE in the last 168 hours. No results for input(s): AMMONIA in the last 168 hours.  CBC:  Recent Labs Lab 07/19/16 1239 07/19/16 1302  WBC 6.6  --   NEUTROABS 4.2  --   HGB 13.5 13.9  HCT 41.7 41.0  MCV 94.8  --   PLT 516*  --     Cardiac Enzymes: No results for input(s): CKTOTAL, CKMB, CKMBINDEX, TROPONINI in  the last 168 hours.  Lipid Panel: No results for input(s): CHOL, TRIG, HDL, CHOLHDL, VLDL, LDLCALC in the last 168 hours.  CBG: No results for input(s): GLUCAP in the last 168 hours.  Microbiology: No results found for this or any previous visit.  Coagulation Studies:  Recent Labs  07/19/16 1239  LABPROT 14.0  INR 1.07    Imaging: Ct Head Wo Contrast  Result Date: 07/19/2016 CLINICAL DATA:  Blurry vision for 3 weeks, worsening. No reported injury. EXAM: CT HEAD WITHOUT CONTRAST TECHNIQUE: Contiguous axial images were obtained from the base of the skull through the vertex without intravenous contrast. COMPARISON:  None. FINDINGS: Brain: No evidence of parenchymal hemorrhage or extra-axial fluid collection. No mass lesion, mass effect, or midline shift. No CT evidence of acute infarction. Nonspecific moderate subcortical and periventricular white matter hypodensity, most in keeping with chronic small vessel ischemic change. Cerebral volume is age appropriate. No ventriculomegaly. Vascular: No acute abnormality. Skull: No evidence of calvarial fracture. Sinuses/Orbits: The visualized paranasal sinuses are essentially clear. Other:  The mastoid air cells are unopacified. IMPRESSION: 1.  No evidence of acute intracranial abnormality. 2. Moderate chronic small vessel ischemic change. Electronically Signed   By: Ilona Sorrel M.D.   On: 07/19/2016 14:21  Assessment and plan per attending neurologist.  Lupita Raider PA-C [facility] [phone]  07/19/2016, 3:51 PM  Assessment and Plan:  76 year old male with new onset disconjugate gaze. I do not think that this is an INO given the inclusion of downgaze deficit on the right. 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  One other possibility given the headaches,  is that temporal arteritis can rarely present as a third nerve palsy.  1) ESR, if elevated then I would consider starting prednisone with outpatient follow-up for temporal artery biopsy  2) if ESR is not elevated, I would use aspirin as an type with therapy, and follow-up as an outpatient with neurology.  Roland Rack, MD [facility] [phone]  If 7pm- 7am, please page neurology on call as listed in [place].

## 2016-07-19 NOTE — ED Notes (Signed)
ED Provider at bedside. 

## 2017-02-03 DEATH — deceased

## 2018-07-20 IMAGING — CT CT HEAD W/O CM
3 series · 16 of 47 positions shown, 19 images · non-contrast
Comparison: None.

CLINICAL DATA: Blurry vision for 3 weeks, worsening. No reported
injury.

EXAM:
CT HEAD WITHOUT CONTRAST
TECHNIQUE: Contiguous axial images were obtained from the base of the skull
through the vertex without intravenous contrast.

[Series 3: head 5.0 h30s · axial · 0.42mm/px · z∈[-148,-13]mm · 10 of 33 slices shown, 13 images]
[im 3/33  brain]
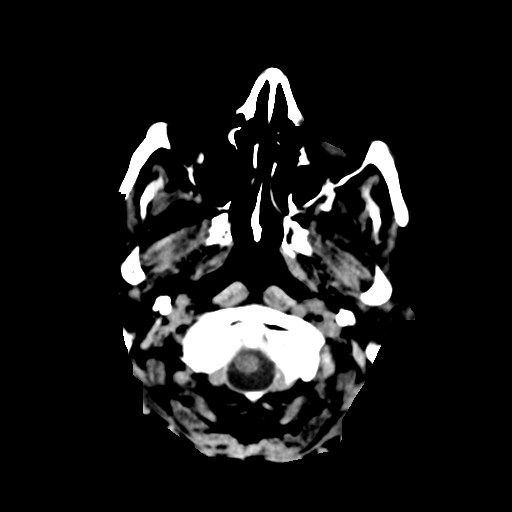
[im 3/33  bone]
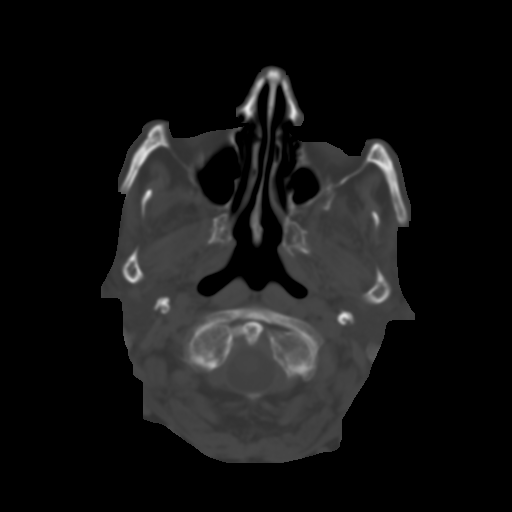
[im 6/33  brain]
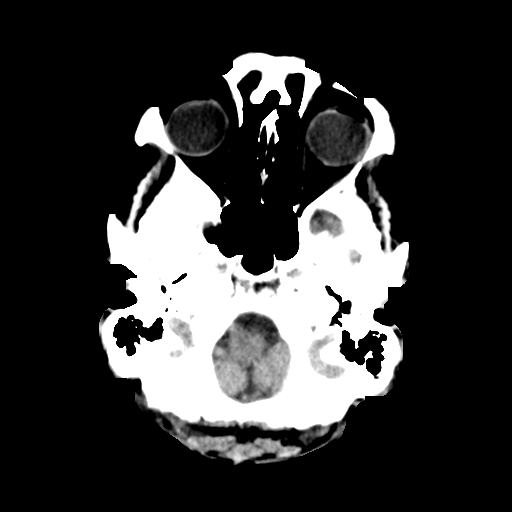
[im 9/33  brain]
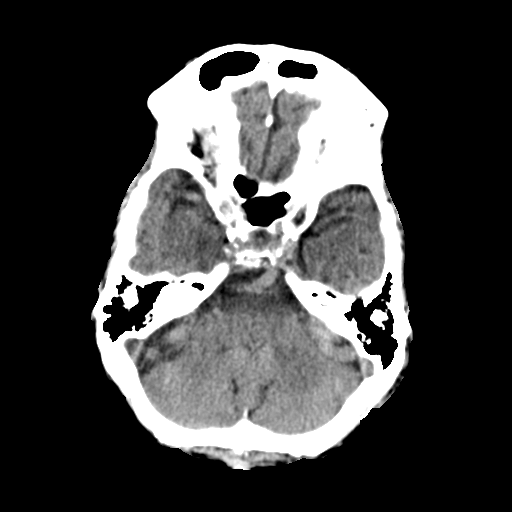
[im 12/33  brain]
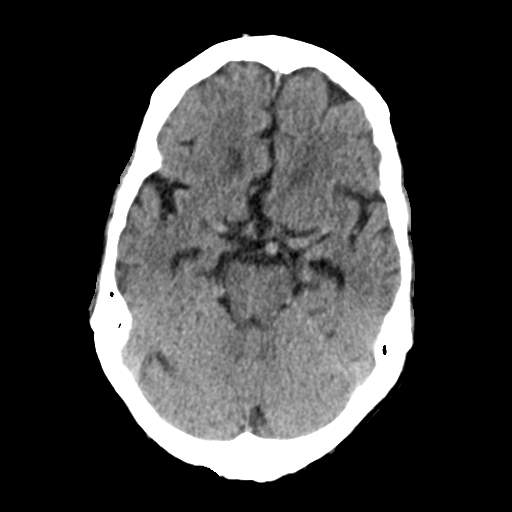
[im 15/33  brain]
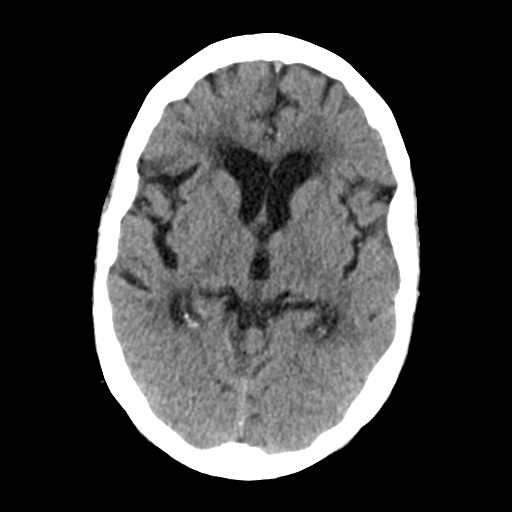
[im 15/33  bone]
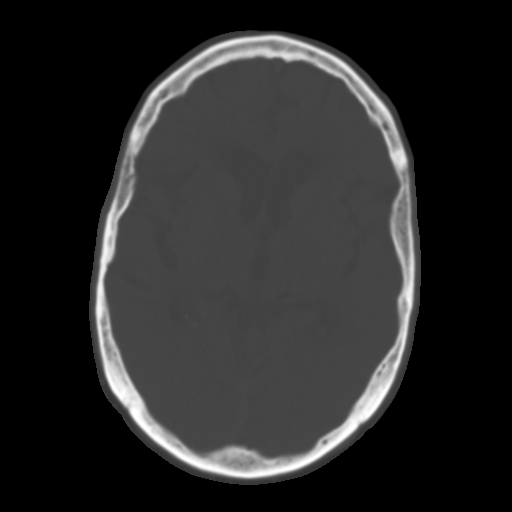
[im 18/33  brain]
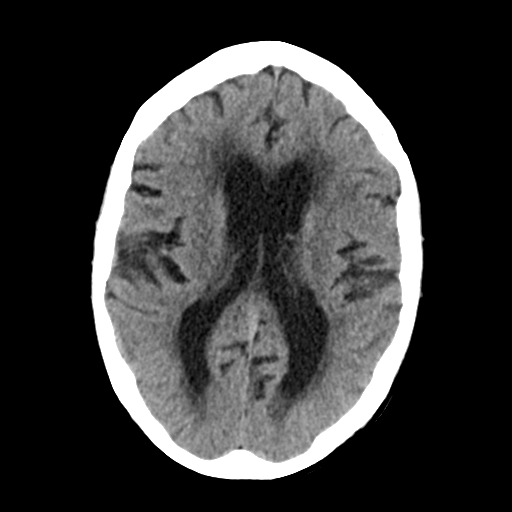
[im 21/33  brain]
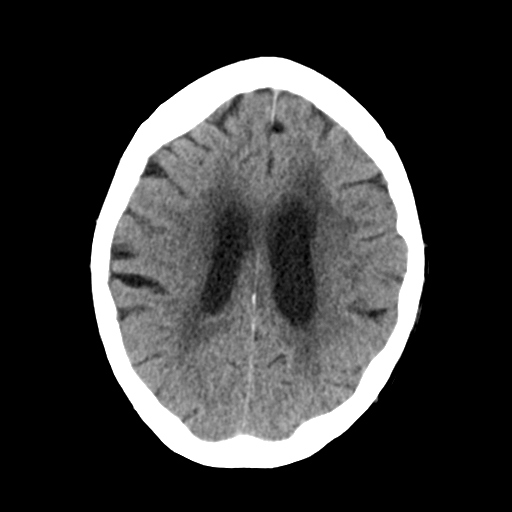
[im 25/33  brain]
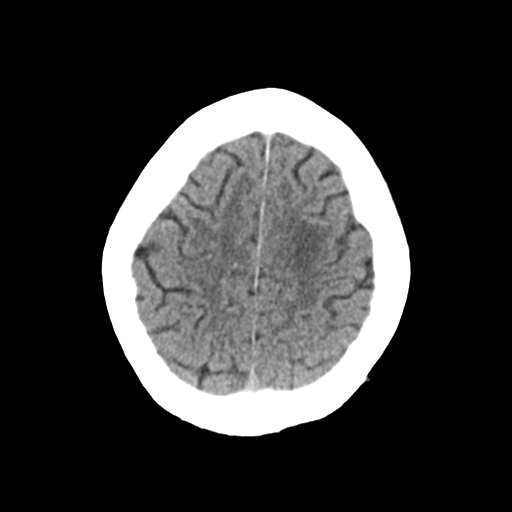
[im 27/33  brain]
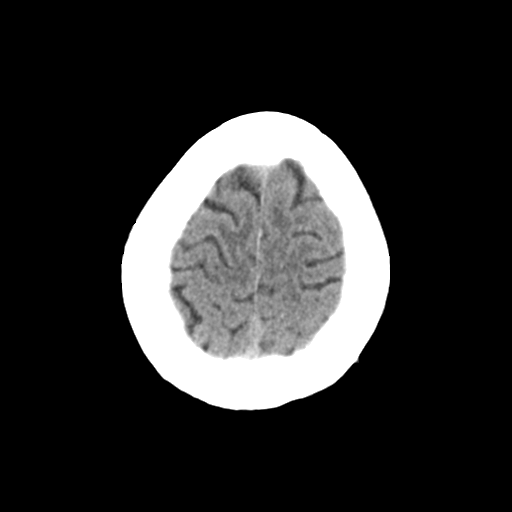
[im 27/33  bone]
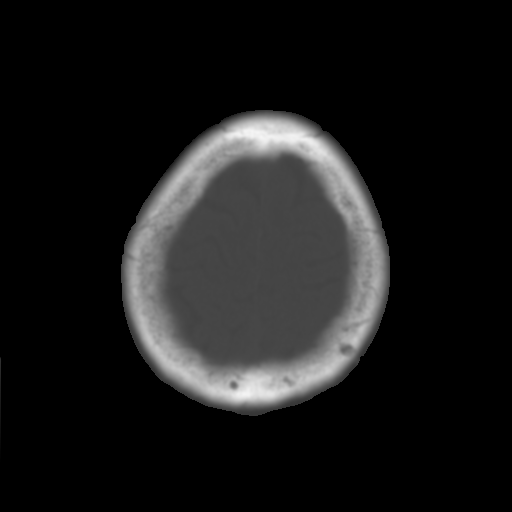
[im 30/33  brain]
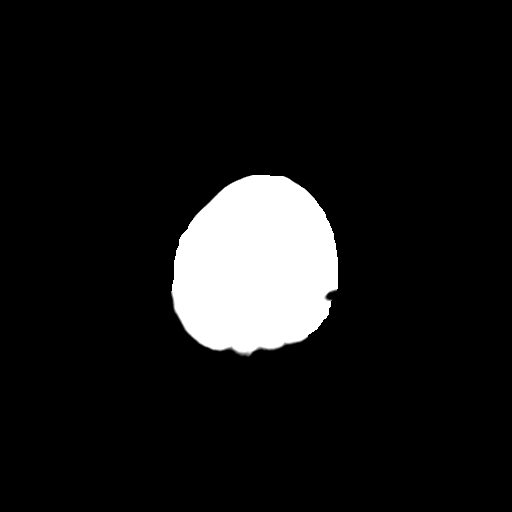

[Series 5: head 3.0 mpr cor · coronal · 0.34mm/px · 3 of 70 slices shown]
[im 24/70  brain]
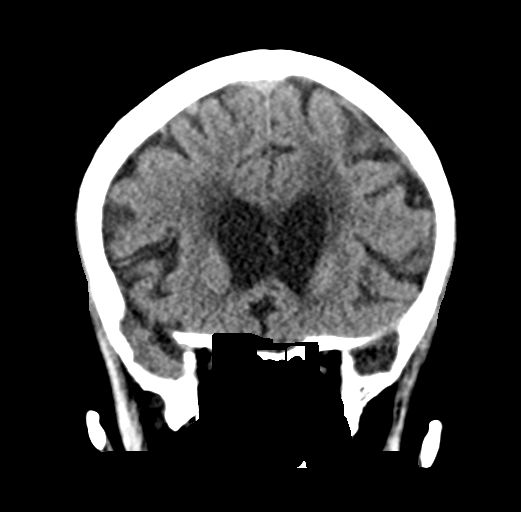
[im 31/70  brain]
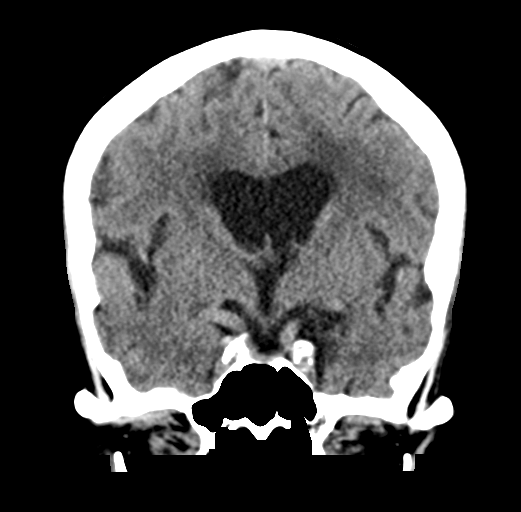
[im 39/70  brain]
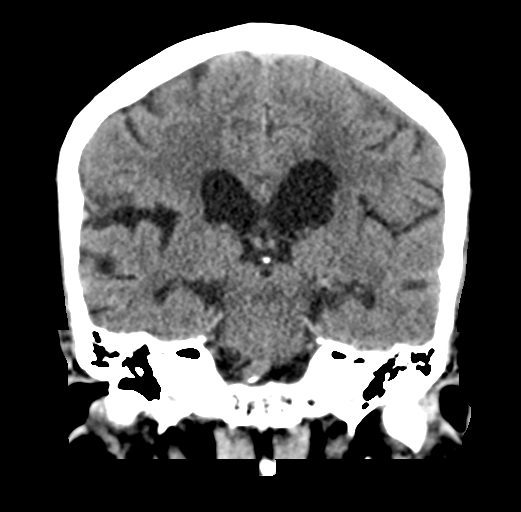

[Series 6: head 3.0 mpr sag · sagittal · 0.34mm/px · 3 of 56 slices shown]
[im 19/56  brain]
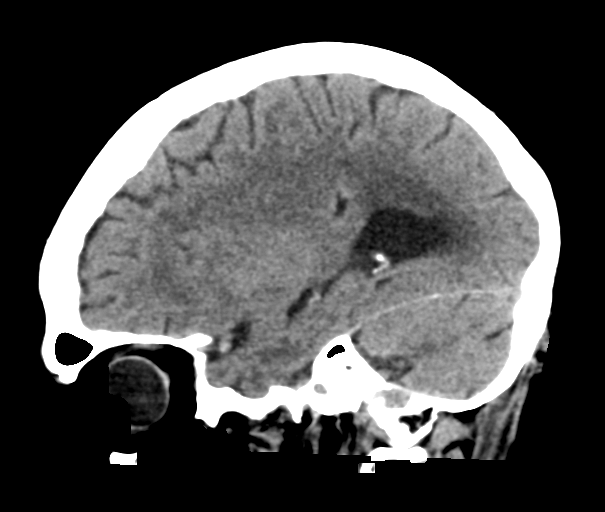
[im 28/56  brain]
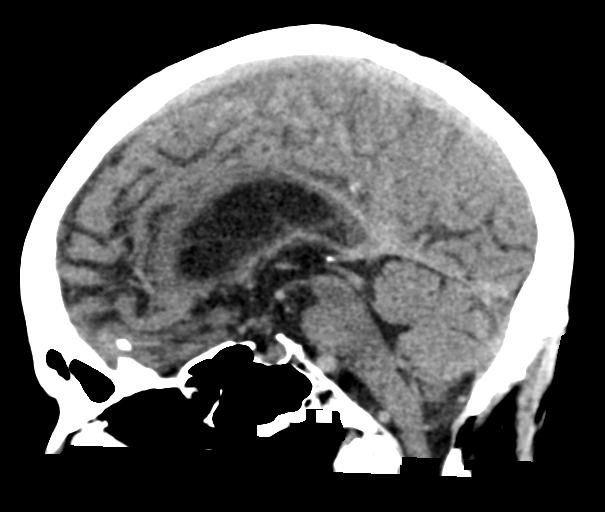
[im 37/56  brain]
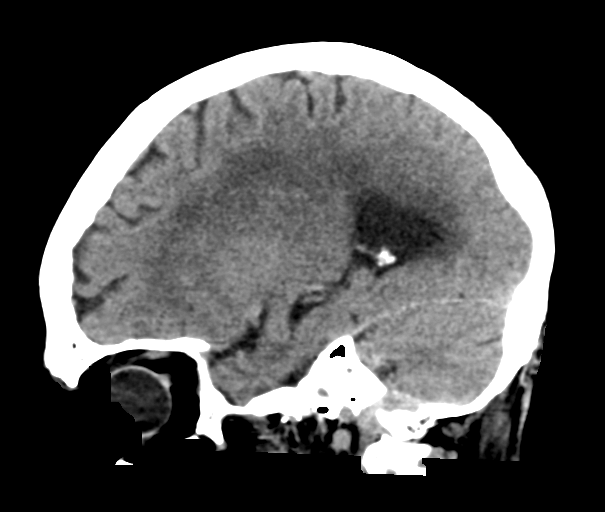

[16 of 47 positions shown; findings below may reference images not displayed]

FINDINGS: Brain: No evidence of parenchymal hemorrhage or extra-axial fluid
collection. No mass lesion, mass effect, or midline shift. No CT
evidence of acute infarction. Nonspecific moderate subcortical and
periventricular white matter hypodensity, most in keeping with
chronic small vessel ischemic change. Cerebral volume is age
appropriate. No ventriculomegaly.

Vascular: No acute abnormality.

Skull: No evidence of calvarial fracture.

Sinuses/Orbits: The visualized paranasal sinuses are essentially
clear.

Other:  The mastoid air cells are unopacified.
IMPRESSION: 1.  No evidence of acute intracranial abnormality.
2. Moderate chronic small vessel ischemic change.
